# Patient Record
Sex: Male | Born: 2000 | Race: White | Hispanic: No | Marital: Single
Health system: Southern US, Community
[De-identification: ages and names within clinical notes are randomized; demographics above are authoritative.]

---
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
## 2020-09-24 DIAGNOSIS — Z20822 Contact with and (suspected) exposure to covid-19: Secondary | ICD-10-CM | POA: Diagnosis not present

## 2021-03-11 DIAGNOSIS — J45909 Unspecified asthma, uncomplicated: Secondary | ICD-10-CM | POA: Diagnosis not present

## 2021-03-11 DIAGNOSIS — J310 Chronic rhinitis: Secondary | ICD-10-CM | POA: Diagnosis not present

## 2021-03-11 DIAGNOSIS — R0789 Other chest pain: Secondary | ICD-10-CM | POA: Diagnosis not present

## 2022-09-11 DIAGNOSIS — R21 Rash and other nonspecific skin eruption: Secondary | ICD-10-CM | POA: Diagnosis not present

## 2022-09-11 DIAGNOSIS — R1030 Lower abdominal pain, unspecified: Secondary | ICD-10-CM | POA: Diagnosis not present

## 2022-09-11 DIAGNOSIS — K4090 Unilateral inguinal hernia, without obstruction or gangrene, not specified as recurrent: Secondary | ICD-10-CM | POA: Diagnosis not present

## 2022-09-11 DIAGNOSIS — R591 Generalized enlarged lymph nodes: Secondary | ICD-10-CM | POA: Diagnosis not present

## 2022-09-11 DIAGNOSIS — L738 Other specified follicular disorders: Secondary | ICD-10-CM | POA: Diagnosis not present

## 2022-09-11 DIAGNOSIS — R0789 Other chest pain: Secondary | ICD-10-CM | POA: Diagnosis not present
